# Patient Record
Sex: Female | Born: 1975 | Hispanic: Yes | Marital: Married
Health system: Southern US, Community
[De-identification: ages and names within clinical notes are randomized; demographics above are authoritative.]

---

## 2006-03-05 ENCOUNTER — Emergency Department: Payer: Self-pay | Admitting: Emergency Medicine

## 2007-04-05 ENCOUNTER — Ambulatory Visit: Payer: Self-pay | Admitting: Obstetrics and Gynecology

## 2007-04-06 ENCOUNTER — Inpatient Hospital Stay: Payer: Self-pay | Admitting: Obstetrics and Gynecology

## 2010-03-14 ENCOUNTER — Encounter: Payer: Self-pay | Admitting: Maternal & Fetal Medicine

## 2010-03-28 ENCOUNTER — Encounter: Payer: Self-pay | Admitting: Obstetrics and Gynecology

## 2010-07-28 ENCOUNTER — Encounter: Payer: Self-pay | Admitting: Obstetrics and Gynecology

## 2012-08-25 IMAGING — US US OB FOLLOW-UP - NRPT [facility]
1 series · 14 of 28 positions shown · non-contrast
Comparison: none

[Series 1: us ob follow-up - nrpt [facility] · 14 of 57 slices shown]
[im 3/57]
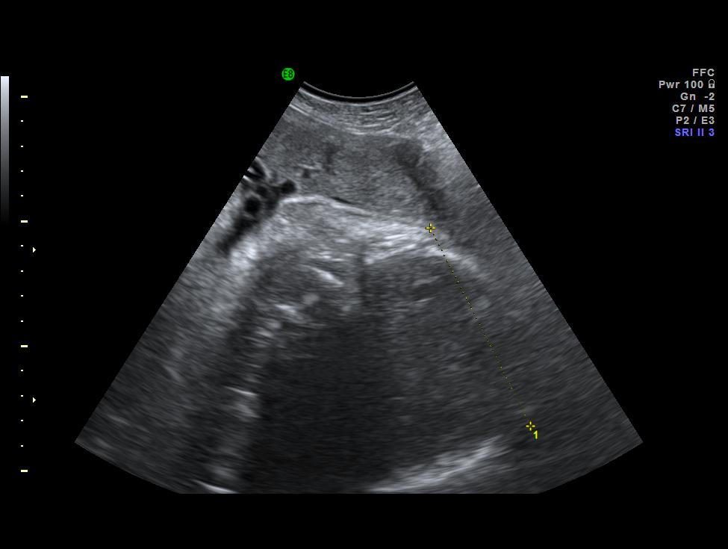
[im 7/57]
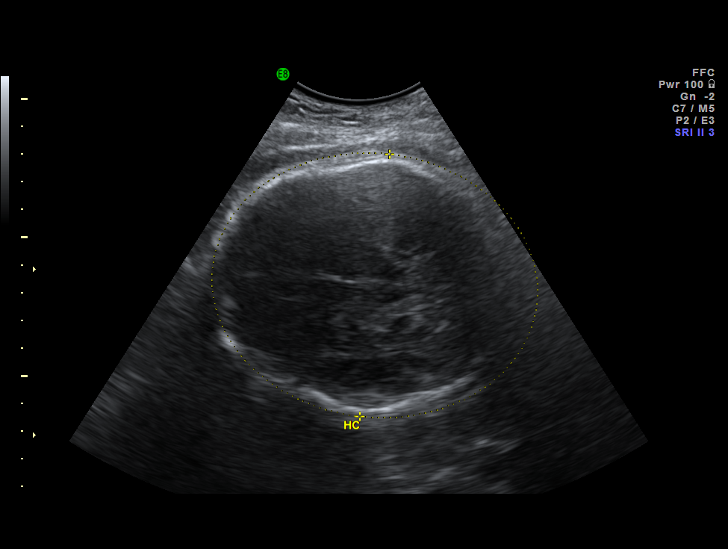
[im 11/57]
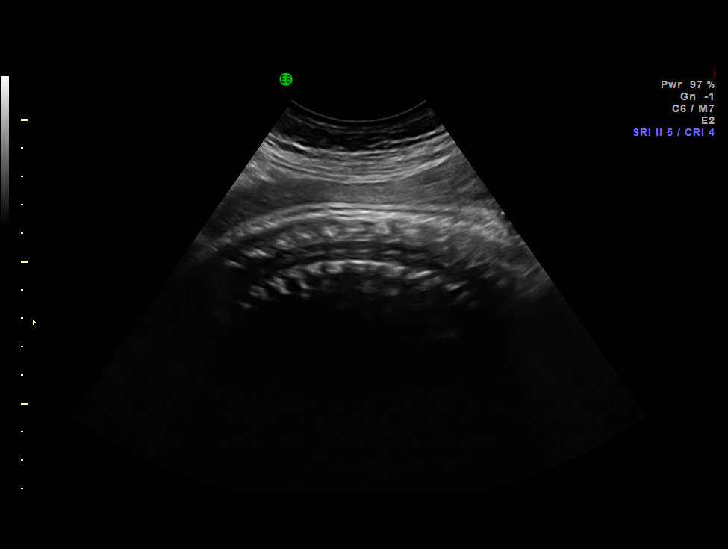
[im 15/57]
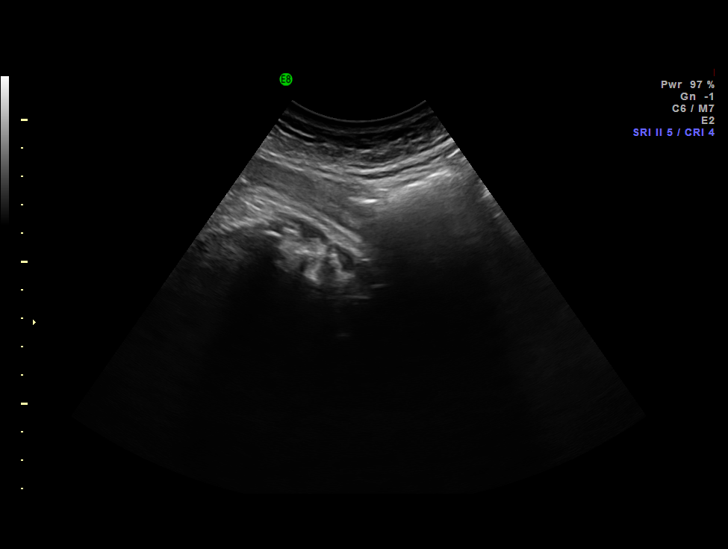
[im 19/57]
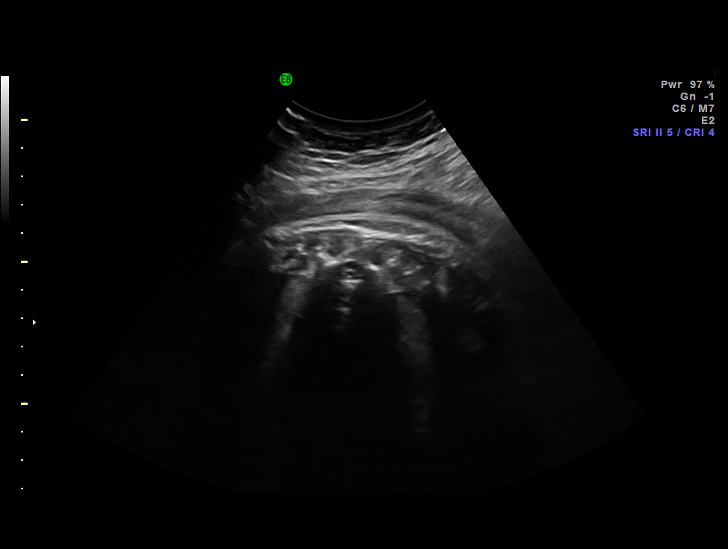
[im 23/57]
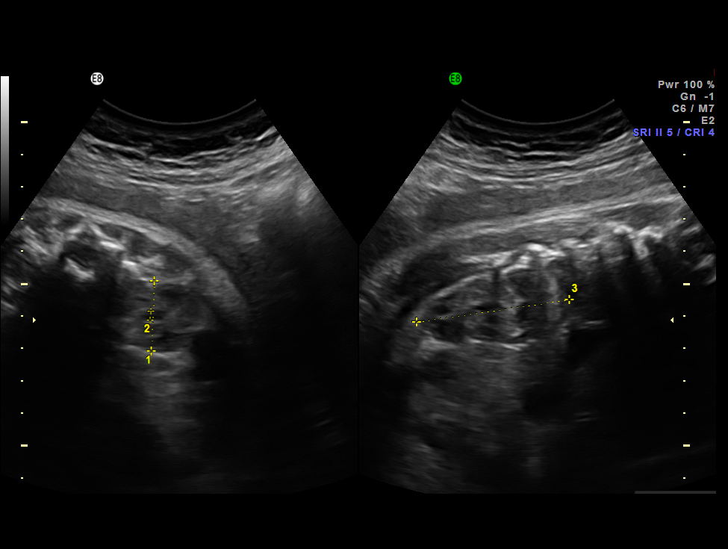
[im 27/57]
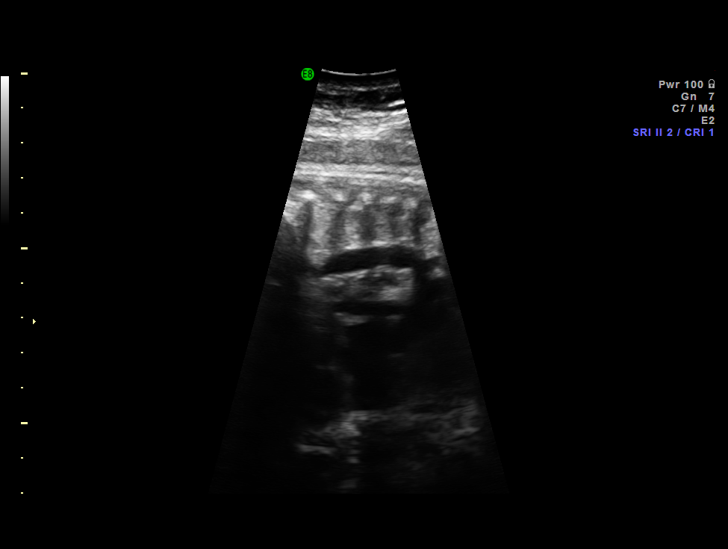
[im 32/57]
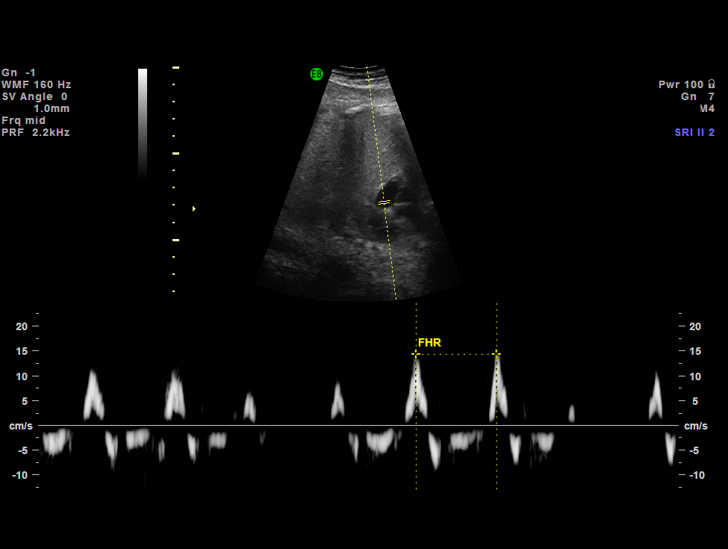
[im 36/57]
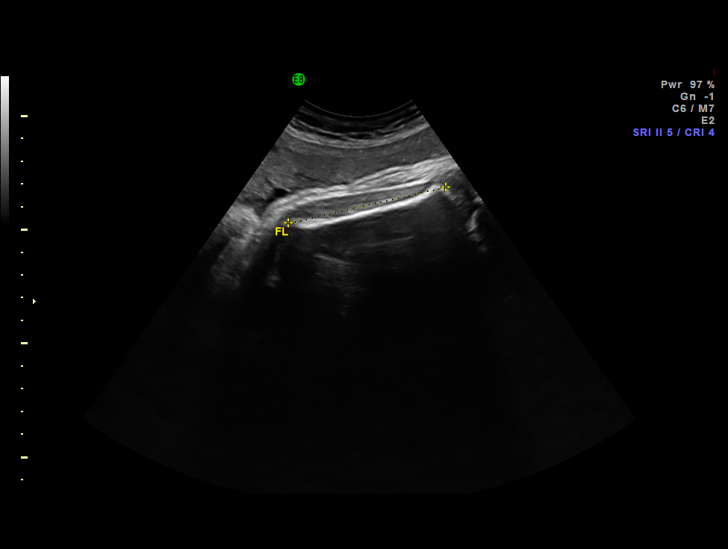
[im 40/57]
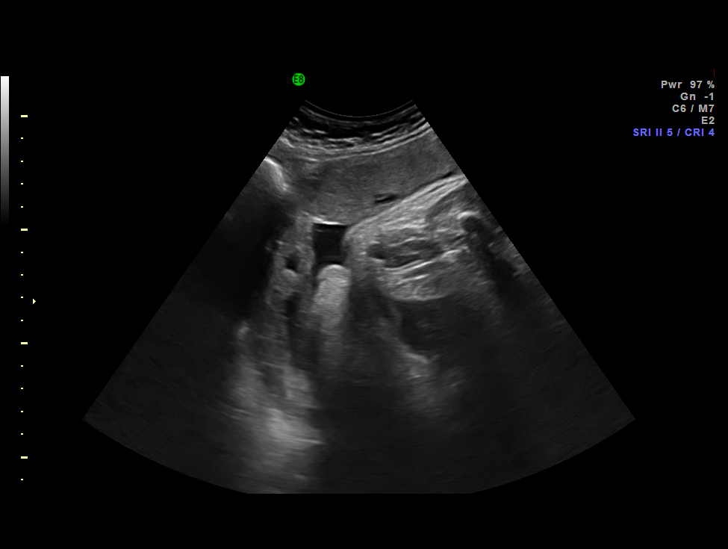
[im 44/57]
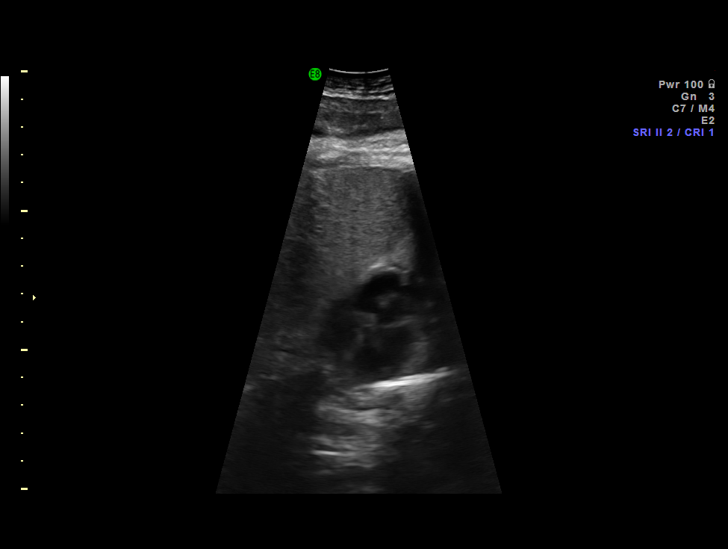
[im 48/57]
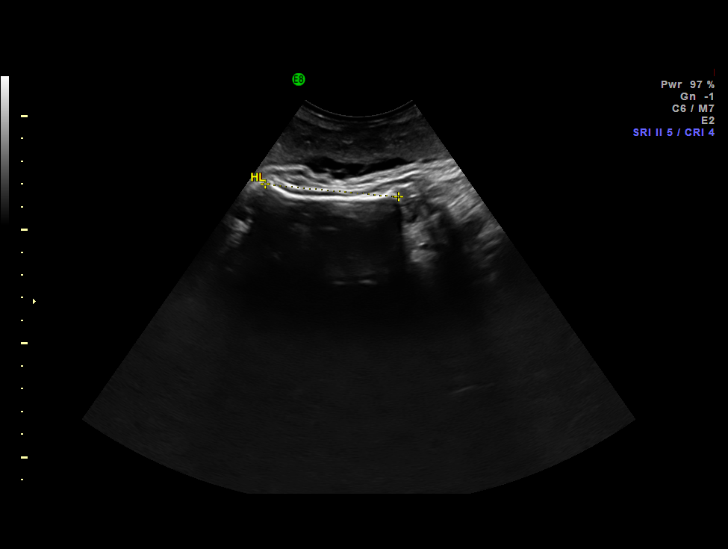
[im 52/57]
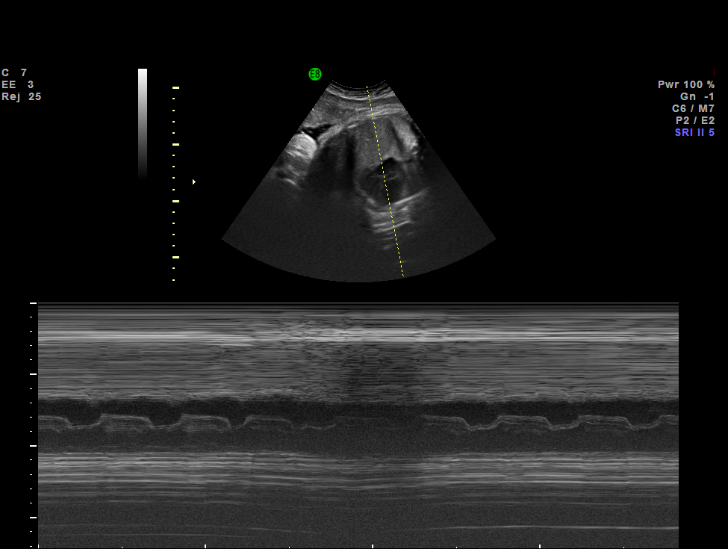
[im 57/57]
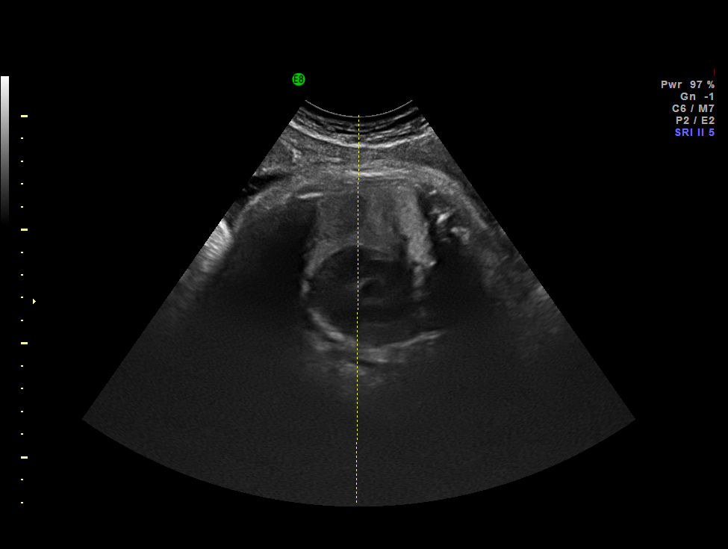

[14 of 28 positions shown; findings below may reference images not displayed]

IMAGES IMPORTED FROM THE [employer] WORKFLOW SYSTEM
NO DICTATION FOR STUDY

## 2014-11-02 ENCOUNTER — Emergency Department
Admit: 2014-11-02 | Disposition: A | Discharge status: Home / Self Care | Payer: Self-pay | Admitting: Emergency Medicine

## 2016-11-30 IMAGING — CR DG LUMBAR SPINE 2-3V
1 series · 3 of 3 positions shown · non-contrast
Comparison: None.

CLINICAL DATA: Low back pain.  No known injury.

EXAM:
LUMBAR SPINE - 2-3 VIEW

[Series 1: dxr lumbar spine ap and lateral · 0.14mm/px · 3 of 3 slices shown]
[im 1/3]
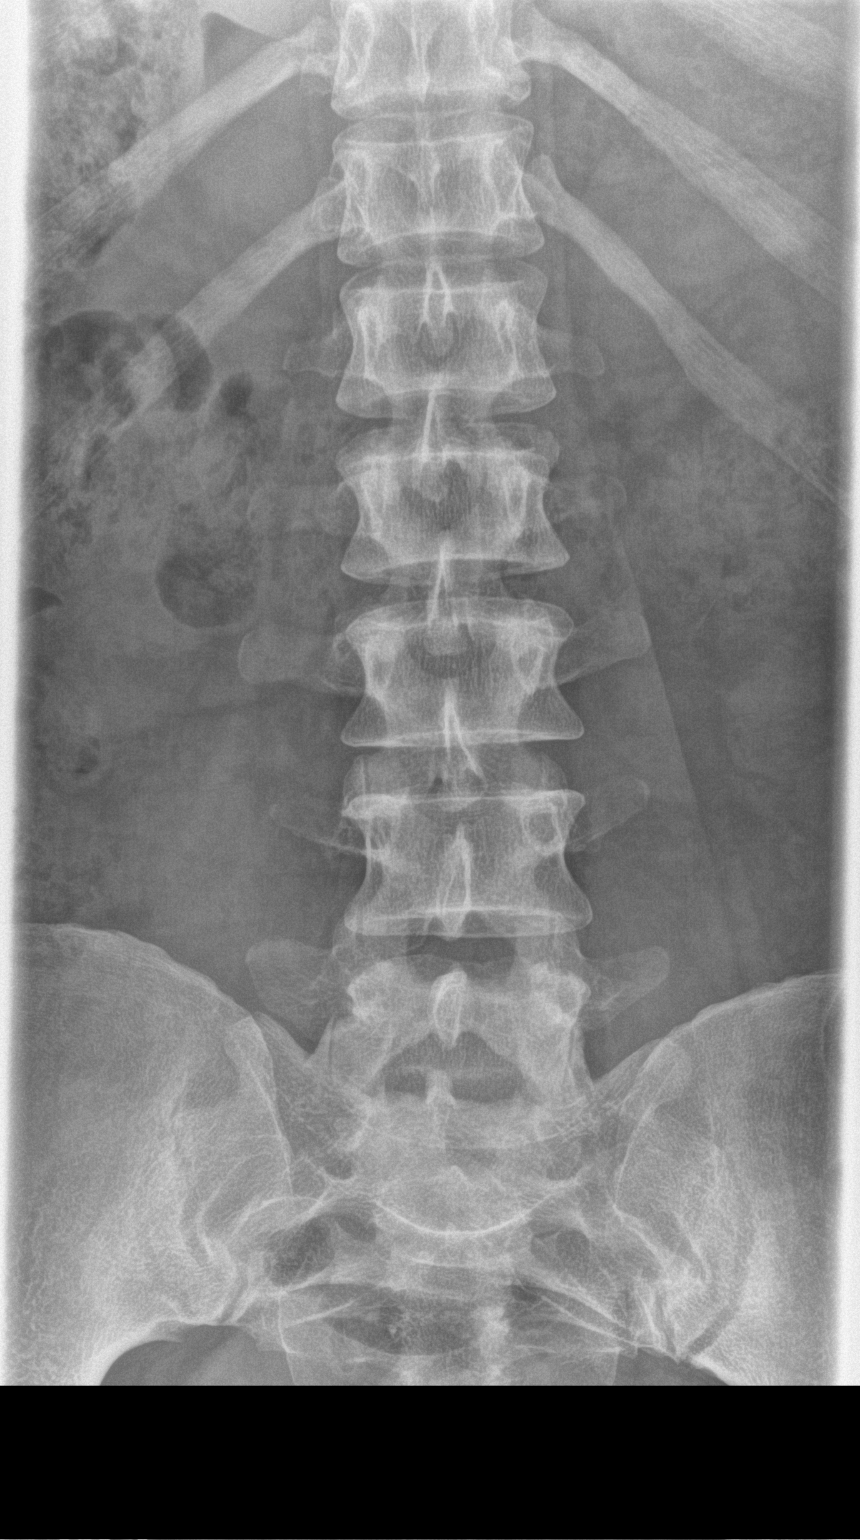
[im 2/3]
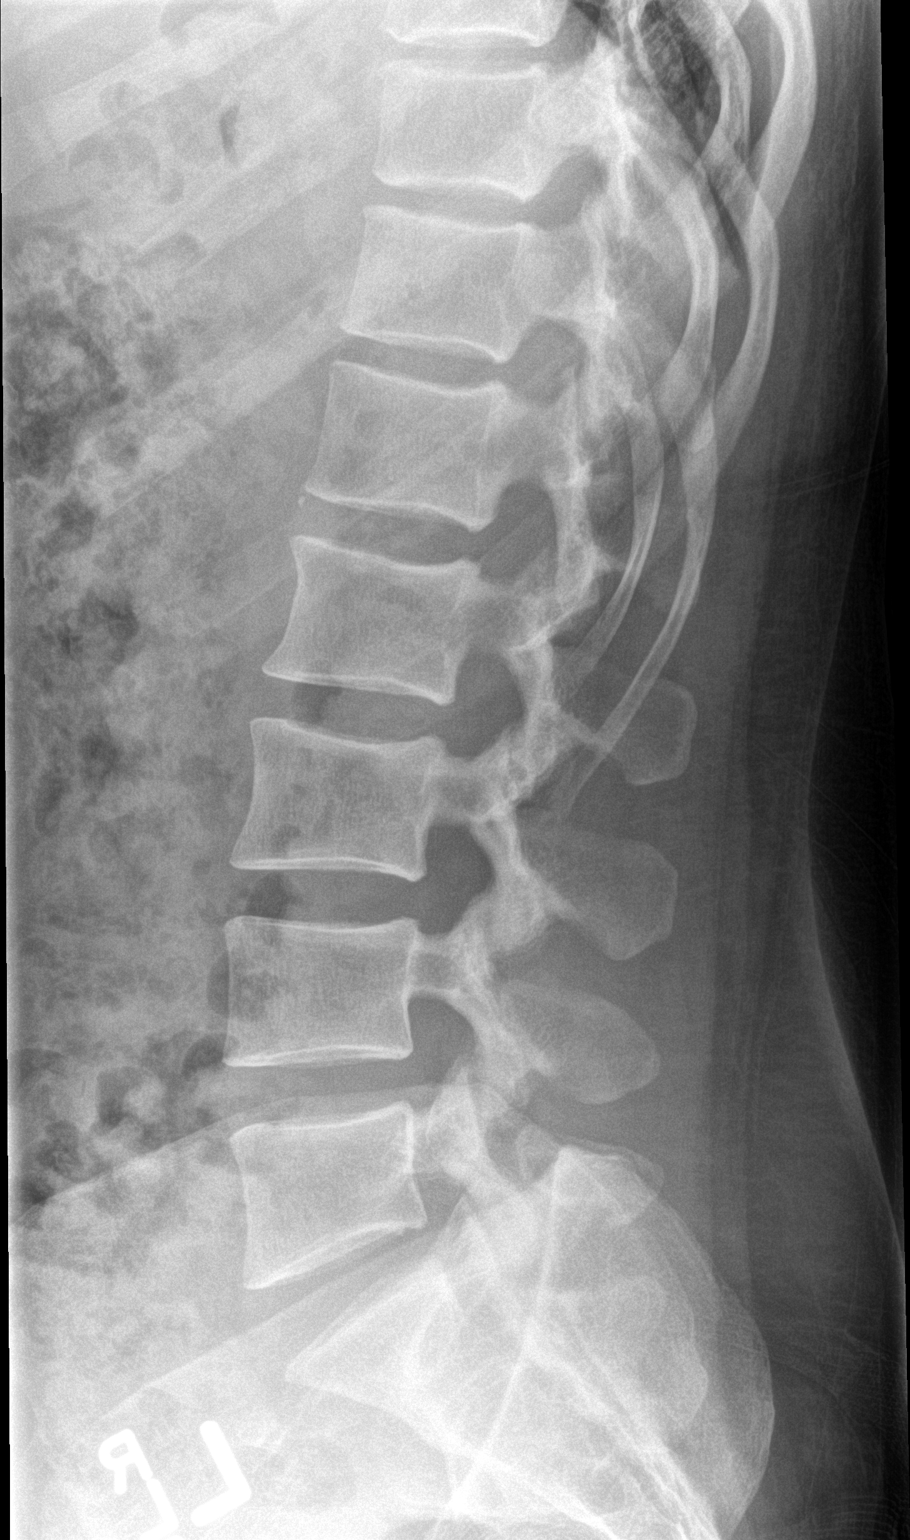
[im 3/3]
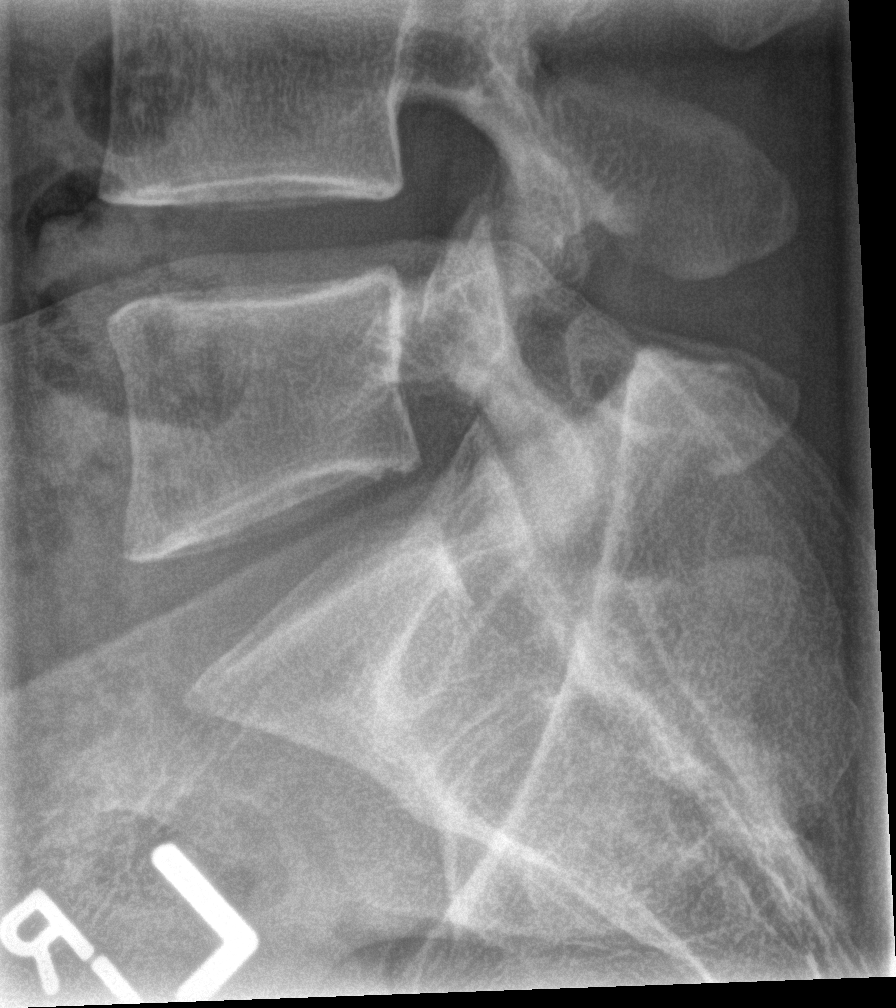

[3 of 3 positions shown; findings below may reference images not displayed]

FINDINGS: There is no evidence of lumbar spine fracture. Alignment is normal.
Intervertebral disc spaces are maintained.
IMPRESSION: Normal exam.

## 2023-02-15 DIAGNOSIS — Z139 Encounter for screening, unspecified: Secondary | ICD-10-CM

## 2023-02-15 LAB — GLUCOSE, POCT (MANUAL RESULT ENTRY): POC Glucose: 131 mg/dl — AB (ref 70–99)

## 2023-02-15 NOTE — Congregational Nurse Program (Signed)
  Dept: [phone]   Congregational Nurse Program Note  Date of Encounter: 02/15/2023  Past Medical History: No past medical history on file.  Encounter Details:  CNP Questionnaire - 02/08/23 1551       Questionnaire   Ask client: Do you give verbal consent for me to treat you today? Yes    Student Assistance N/A    Location Patient Served  [facility]    Visit Setting with Client Organization    Patient Status Unknown    Insurance Unknown    Insurance/Financial Assistance Referral N/A    Medication N/A    Medical Provider No    Screening Referrals Made N/A    Medical Referrals Made N/A    Medical Appointment Made N/A    Recently w/o PCP, now 1st time PCP visit completed due to CNs referral or appointment made N/A    Food N/A    Transportation N/A    Housing/Utilities N/A    Interpersonal Safety N/A    Interventions N/A    Abnormal to Normal Screening Since Last CN Visit N/A    Screenings CN Performed Blood Pressure;Blood Glucose;Weight;BMI    Sent Client to Lab for: N/A    Did client attend any of the following based off CNs referral or appointments made? N/A    ED Visit Averted N/A    Life-Saving Intervention Made N/A                02/15/2023    4:03 PM  Vitals with BMI  Weight 146 lbs  Systolic 118  Diastolic 73    Client here for blood pressure and blood glucose check.
